# Patient Record
Sex: Male | Born: 1965 | Race: White | Hispanic: No
Health system: Western US, Academic
[De-identification: ages and names within clinical notes are randomized; demographics above are authoritative.]

---

## 2012-01-28 IMAGING — CR DG ELBOW COMPLETE 3+V*L*
4 series · 4 of 4 positions shown · non-contrast
Comparison: None.

CLINICAL DATA: Deep laceration to elbow.

LEFT ELBOW - COMPLETE 3+ VIEW

[x elbow joint ap left]
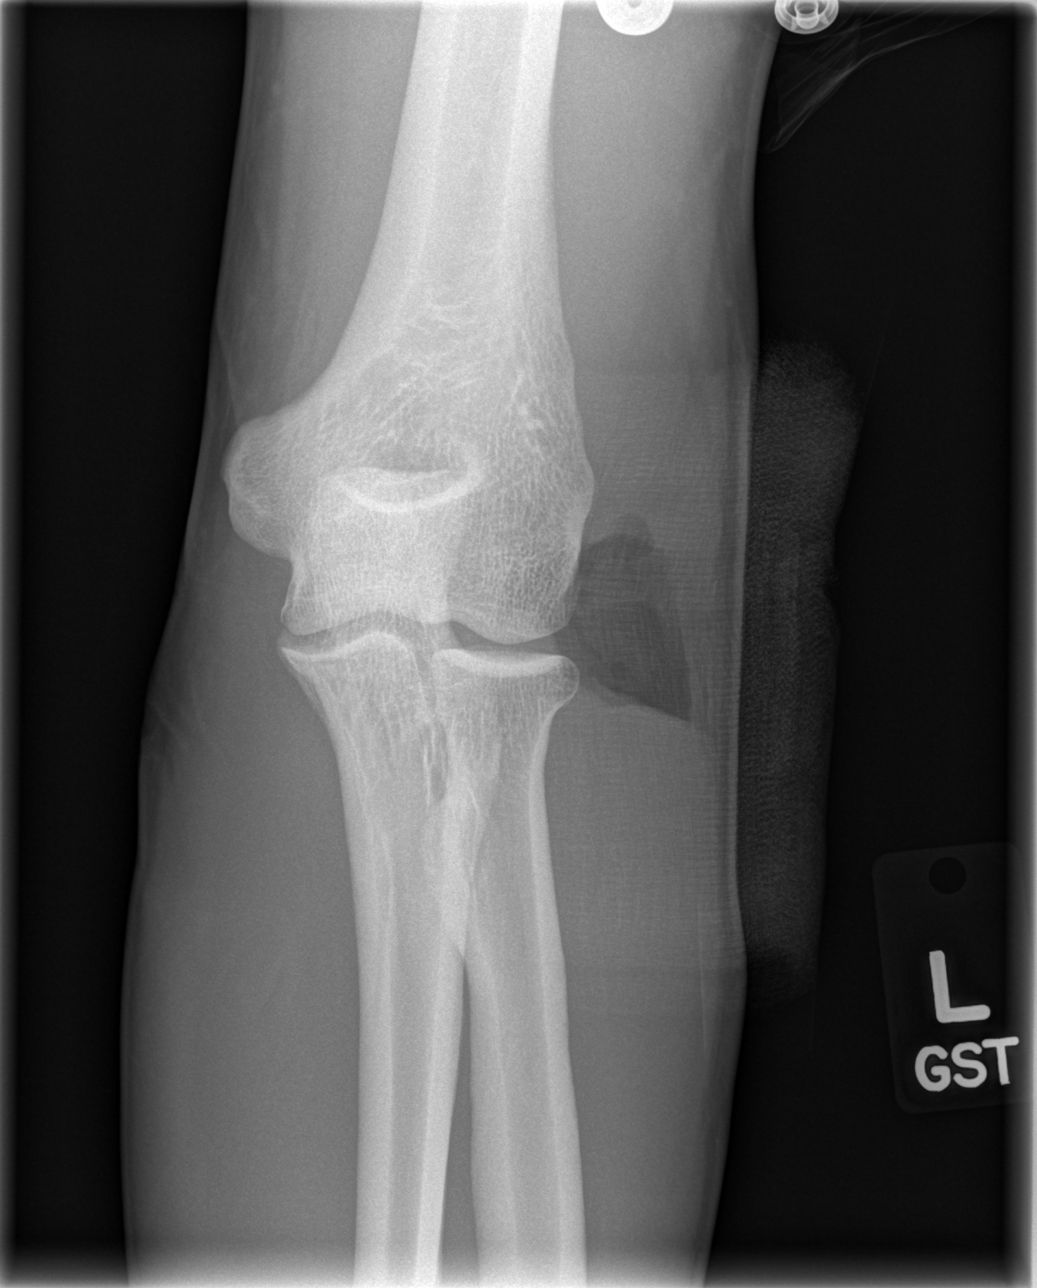

[x elbow joint obl. left (1 of 2)]
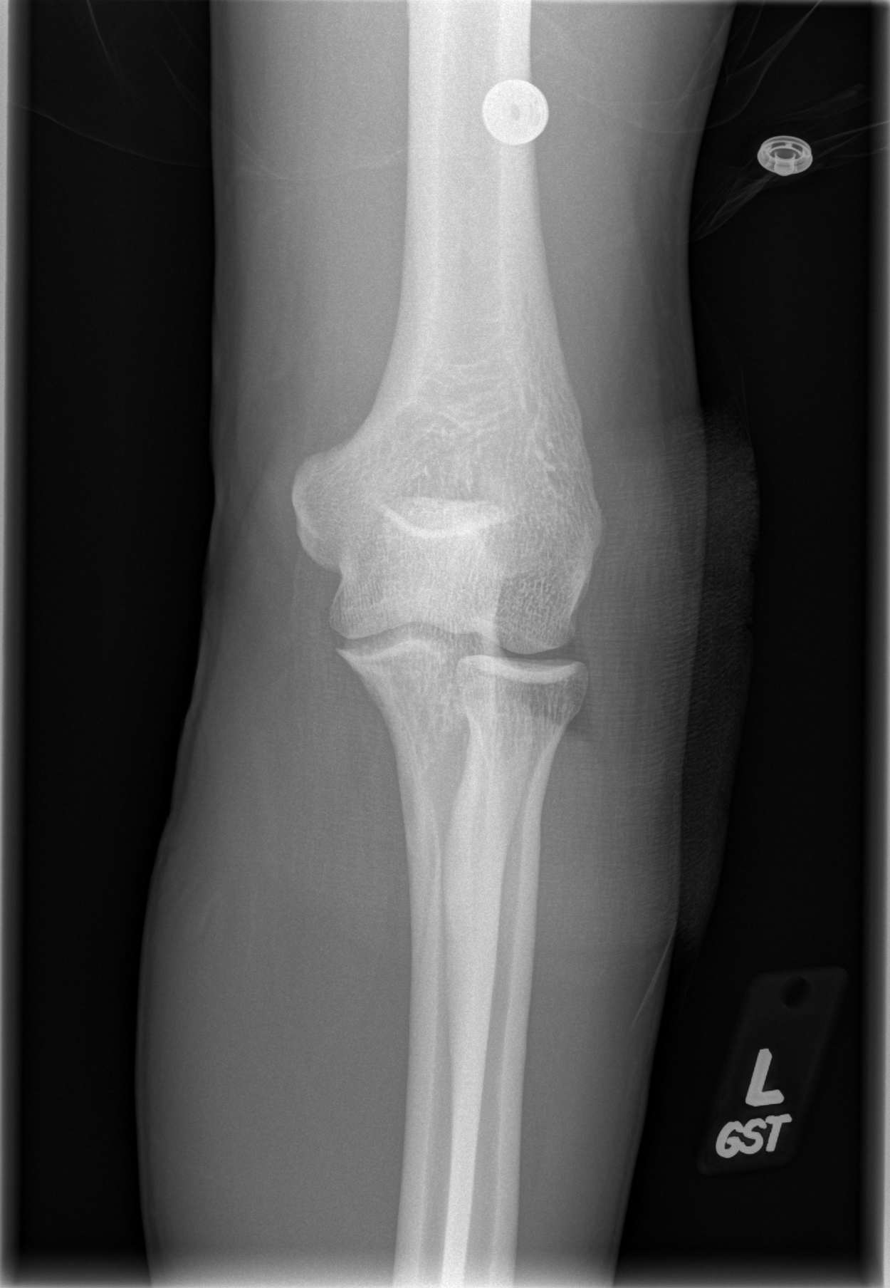

[x elbow joint obl. left (2 of 2)]
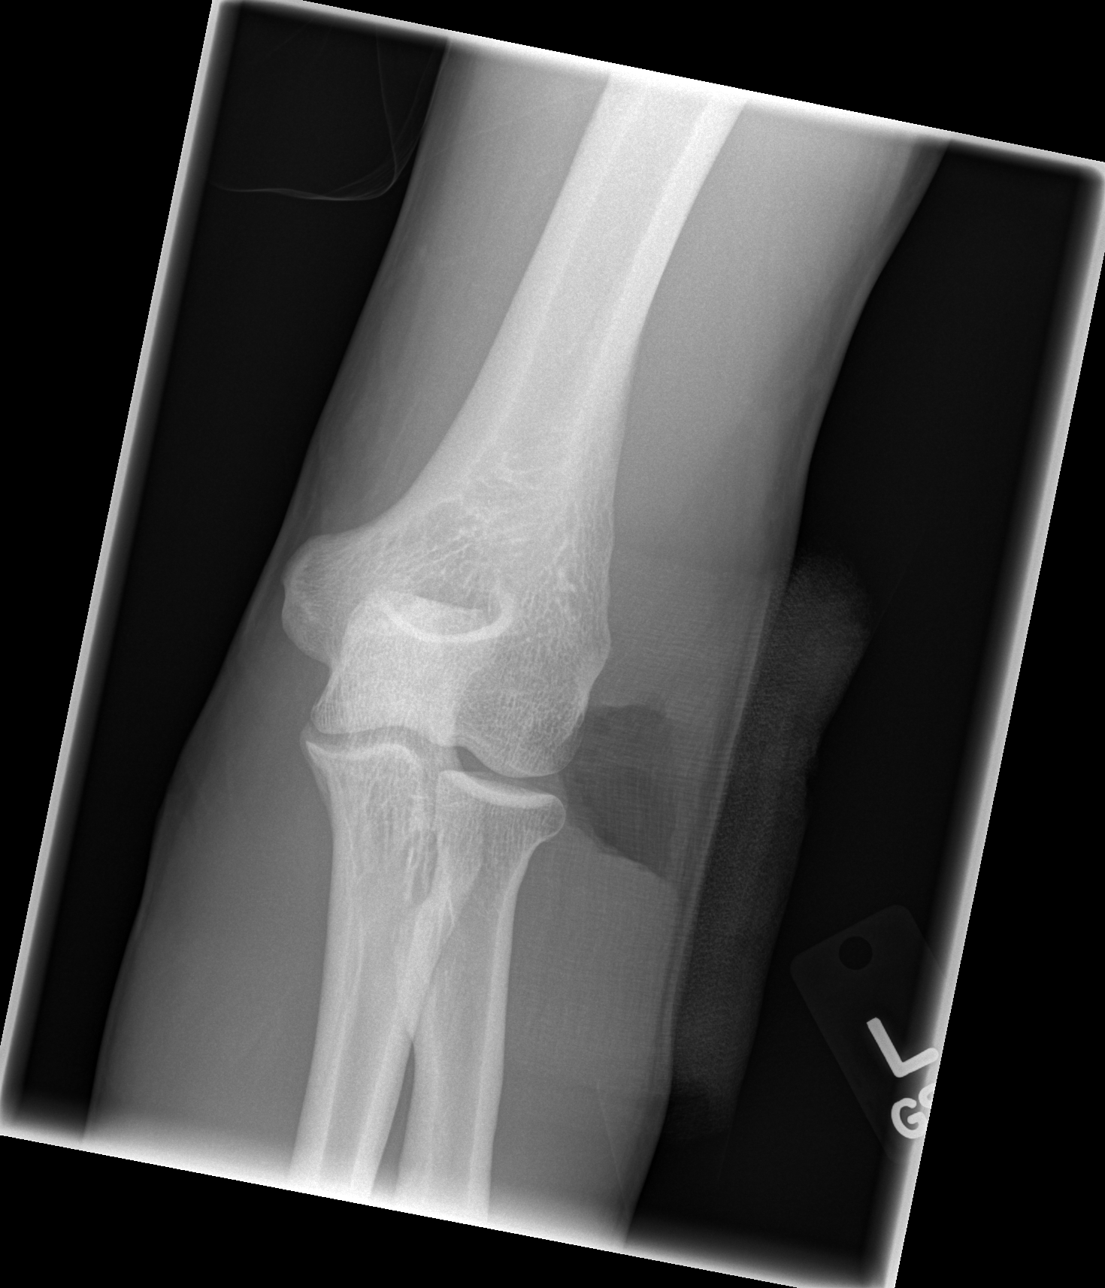

[x elbow joint lat left]
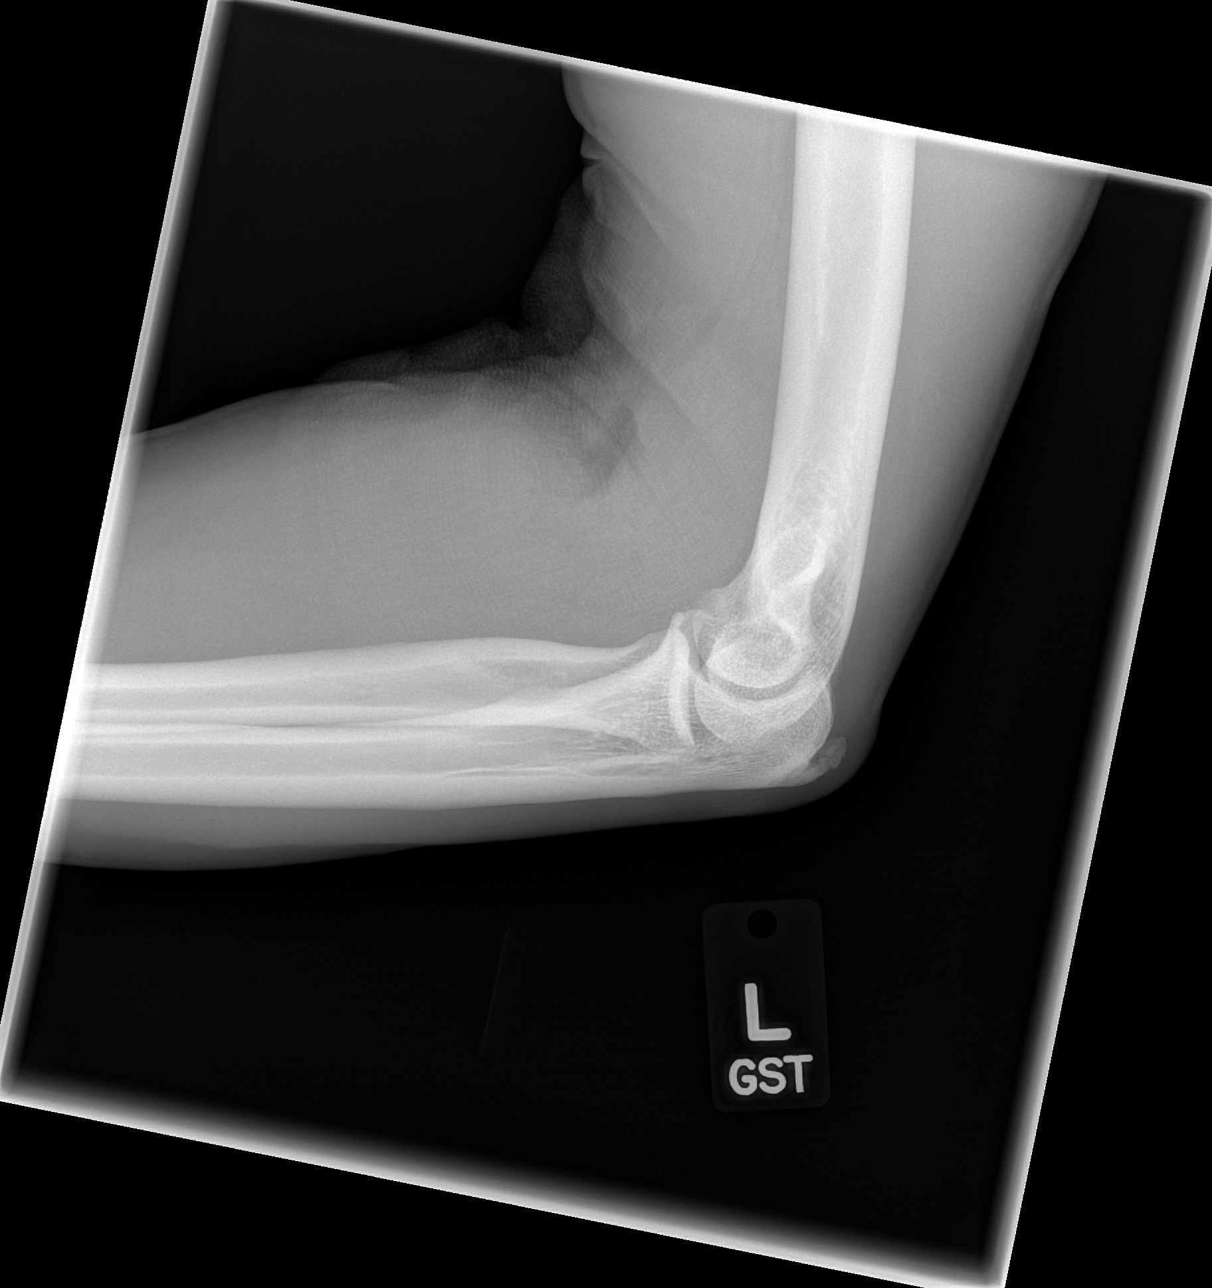

[4 of 4 positions shown; findings below may reference images not displayed]

FINDINGS: Soft tissue injury about the radial side of the elbow.
No radiopaque foreign object.  No fracture or dislocation.
Enthesopathic change at the triceps insertion.
IMPRESSION: Soft tissue injury without acute osseous abnormality.

## 2014-12-28 ENCOUNTER — Encounter (INDEPENDENT_AMBULATORY_CARE_PROVIDER_SITE_OTHER): Payer: Self-pay | Admitting: Family Medicine

## 2014-12-28 ENCOUNTER — Ambulatory Visit (INDEPENDENT_AMBULATORY_CARE_PROVIDER_SITE_OTHER): Payer: No Typology Code available for payment source | Admitting: Family Medicine

## 2014-12-28 ENCOUNTER — Telehealth (INDEPENDENT_AMBULATORY_CARE_PROVIDER_SITE_OTHER): Payer: Self-pay | Admitting: Family Medicine

## 2014-12-28 VITALS — BP 133/78 | HR 89 | Temp 96.8°F | Resp 16 | Wt 193.0 lb

## 2014-12-28 DIAGNOSIS — J111 Influenza due to unidentified influenza virus with other respiratory manifestations: Secondary | ICD-10-CM

## 2014-12-28 MED ORDER — BENZONATATE 200 MG OR CAPS
200.0000 mg | ORAL_CAPSULE | Freq: Three times a day (TID) | ORAL | Status: AC | PRN
Start: 2014-12-28 — End: 2015-01-04

## 2014-12-28 MED ORDER — CLARITHROMYCIN 500 MG OR TABS
500.0000 mg | ORAL_TABLET | Freq: Two times a day (BID) | ORAL | Status: AC
Start: 2014-12-28 — End: 2015-01-04

## 2014-12-28 NOTE — Patient Instructions (Signed)
Influenza (Adult)    Influenza is also called the flu. It is a viral illness that affects the air passages of your lungs. It is different from the common cold. The flu can easily be passed from one to person to another. It may be spread through the air by coughing and sneezing. Or it can be spread by touching the sick person and then touching your own eyes, nose, or mouth.  The flu starts 1 to 3 days after you are exposed to the flu virus. It may lastfor 1 to 2 weeks. You usually don't need to take antibiotics unless you have a complication. This might be an ear or sinus infection or pneumonia.  Symptoms of the flu may be mild or severe. They can include extreme tiredness (wanting to stay in bed all day), chills, fevers, muscle aches, soreness with eye movement, headache, and a dry, hacking cough.  Home care  Follow these guidelines when caring for yourself at home:   Avoid being around cigarette smoke, whether yours or other people's.   Acetaminophen or ibuprofen will help ease your fever, muscle aches, and headache. Don't give aspirin to anyone younger than 18 who has the flu. Aspirin can harm the liver.   Nausea and loss of appetite are common with the flu. Eat light meals. Drink 6 to 8 glasses of liquids every day. Good choices are water, sport drinks, soft drinks without caffeine, juices, tea, and soup. Extra fluids will also help loosen secretions in your nose and lungs.   Over-the-counter cold medicines will not make the flu go away faster. But the medicines may help with coughing, sore throat, and congestion in your nose and sinuses. Don't use a decongestant if you have high blood pressure.   Stay home until your fever has been gone for at least 24 hours without using medicine to reduce fever.  Follow-up care  Follow up with your health care provider, or as advised, if you are not getting better over the next week.  If you are 65 or older, talk with your provider about getting a pneumococcal vaccine  every 5 years. You should also get this vaccine if you have chronic asthma or COPD. All adults should get a flu vaccine every fall. Ask your provider about this.  When to seek medical advice  Call your health care provider right away if any of these occur:   Cough with lots of colored sputum (mucus) or blood in your sputum   Chest pain, shortness of breath, wheezing, or difficulty breathing   Severe headache, or face, neck, or ear pain   New rashwith fever   Fever of 101F (38C) oral or higher that doesn't get better with fever medicine   Confusion, behavior change, or seizure   Severe weakness or dizziness   You get a fever or cough after getting better for a few days     2000-2015 [employer] [address], [place] [address]. All rights reserved. This information is not intended as a substitute for professional medical care. Always follow your healthcare professional's instructions.

## 2014-12-28 NOTE — Telephone Encounter (Signed)
Call pt with final xray results, normal.

## 2014-12-28 NOTE — Telephone Encounter (Signed)
Spoke to pt results given pt understood.

## 2014-12-28 NOTE — Progress Notes (Signed)
Patient Referred By: No ref. provider found  Patient's PCP: No primary care provider on file.  [facility]    Urgent Care Family Medicine Note    Chief Complaint    Mr. KHRYSTIAN SCHAUF is a 49 year old year old male who presents to [facility] for   Chief Complaint   Patient presents with   . URI     Sx x 1 wk, went to [place] on Monday dx with flu, started coughing blood last night, chest feels like its on fire, went back to [place] but [employer] . is broken//[facility]          Subjective:  Patient is a 49 year old male, here to discuss URI        HPI Comments: Pt dx with influenza.    URI   This is a new problem. The current episode started in the past 7 days. The problem has been rapidly worsening. There has been no fever. Associated symptoms include congestion and coughing (hemoptysis). Pertinent negatives include no chest pain, headaches or vomiting. He has tried decongestant for the symptoms. The treatment provided no relief.       Review of Systems   Constitutional: Negative for fever.   HENT: Positive for congestion.    Respiratory: Positive for cough (hemoptysis).    Cardiovascular: Negative for chest pain.   Gastrointestinal: Negative for vomiting.   Skin: Negative for wound.   Allergic/Immunologic: Negative for immunocompromised state.   Neurological: Negative for dizziness, light-headedness and headaches.   Psychiatric/Behavioral: Negative for confusion and agitation.     I personally reviewed and confirmed the ROS, past medical history and social history in the record with the patient today.        Objective:  Physical Exam   Constitutional: He is oriented to person, place, and time. He appears well-developed and well-nourished. No distress.   HENT:   Head: Normocephalic and atraumatic.   Right Ear: External ear normal.   Left Ear: External ear normal.   Mouth/Throat: Oropharynx is clear and moist. No oropharyngeal exudate.   Eyes: Conjunctivae and EOM are  normal. Pupils are equal, round, and reactive to light. No scleral icterus.   Neck: Normal range of motion. Neck supple.   Cardiovascular: Normal rate, regular rhythm and normal heart sounds.    Pulmonary/Chest: Effort normal and breath sounds normal.   Lymphadenopathy:     He has no cervical adenopathy.   Neurological: He is alert and oriented to person, place, and time.   Skin: Skin is warm and dry.   Psychiatric: He has a normal mood and affect. His behavior is normal.        Assessment and Plan:   Diagnoses and all orders for this visit:    Influenza  Orders:  -     X-RAY CHEST 2 VW  -     Clarithromycin (BIAXIN) 500 MG Oral Tab; Take 1 tablet (500 mg) by mouth 2 times a day for 7 days. Take until gone.  -     Benzonatate 200 MG Oral Cap; Take 1 capsule (200 mg) by mouth 3 times a day as needed for cough for up to 7 days.    Other orders  -     ALBUTEROL IN;   -     Phenylephrine HCl 10 MG Oral Tab;   -     Acetaminophen 325 MG Oral Tab;   -     Ibuprofen 200  MG Oral Tab;       Results for orders placed in visit on 12/28/14   X-RAY CHEST 2 VW    Narrative EXAMINATION:   XR CHEST 2 VIEWS      CLINICAL INDICATION:   flu, hemoptysis.     COMPARISON:  None     FINDINGS AND IMPRESSION:  Normal.          ATTENDING RADIOLOGIST AND PAGER NUMBER  [id number] [place] Eric James MD  [phone]       ED, infection and medication precautions discussed. Expect improvement in 2-3 days. FU prn.
